# Patient Record
Sex: Female | Born: 1990 | Race: Black or African American | Hispanic: No | Marital: Single | Smoking: Never smoker
Health system: Southern US, Community
[De-identification: ages and names within clinical notes are randomized; demographics above are authoritative.]

---
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
## 2014-04-24 ENCOUNTER — Ambulatory Visit: Payer: BLUE CROSS/BLUE SHIELD

## 2014-05-11 ENCOUNTER — Emergency Department (HOSPITAL_COMMUNITY)
Admission: EM | Admit: 2014-05-11 | Discharge: 2014-05-11 | Disposition: A | Discharge status: Home / Self Care | Payer: No Typology Code available for payment source | Attending: Emergency Medicine | Admitting: Emergency Medicine

## 2014-05-11 ENCOUNTER — Emergency Department (HOSPITAL_COMMUNITY): Payer: No Typology Code available for payment source

## 2014-05-11 ENCOUNTER — Encounter (HOSPITAL_COMMUNITY): Payer: Self-pay | Admitting: Family Medicine

## 2014-05-11 DIAGNOSIS — S4990XA Unspecified injury of shoulder and upper arm, unspecified arm, initial encounter: Secondary | ICD-10-CM | POA: Diagnosis not present

## 2014-05-11 DIAGNOSIS — Y9389 Activity, other specified: Secondary | ICD-10-CM | POA: Diagnosis not present

## 2014-05-11 DIAGNOSIS — S29002A Unspecified injury of muscle and tendon of back wall of thorax, initial encounter: Secondary | ICD-10-CM | POA: Insufficient documentation

## 2014-05-11 DIAGNOSIS — Y998 Other external cause status: Secondary | ICD-10-CM | POA: Diagnosis not present

## 2014-05-11 DIAGNOSIS — Y92410 Unspecified street and highway as the place of occurrence of the external cause: Secondary | ICD-10-CM | POA: Insufficient documentation

## 2014-05-11 DIAGNOSIS — M546 Pain in thoracic spine: Secondary | ICD-10-CM

## 2014-05-11 DIAGNOSIS — S199XXA Unspecified injury of neck, initial encounter: Secondary | ICD-10-CM | POA: Diagnosis not present

## 2014-05-11 DIAGNOSIS — S0990XA Unspecified injury of head, initial encounter: Secondary | ICD-10-CM | POA: Insufficient documentation

## 2014-05-11 DIAGNOSIS — M542 Cervicalgia: Secondary | ICD-10-CM

## 2014-05-11 MED ORDER — HYDROCODONE-ACETAMINOPHEN 5-325 MG PO TABS: 1.0000 | ORAL_TABLET | Freq: Four times a day (QID) | ORAL | Status: AC | PRN

## 2014-05-11 MED ORDER — HYDROCODONE-ACETAMINOPHEN 5-325 MG PO TABS
2.0000 | ORAL_TABLET | Freq: Once | ORAL | Status: AC
  Administered 2014-05-11: 2 via ORAL
  Filled 2014-05-11: qty 2

## 2014-05-11 MED ORDER — NAPROXEN 500 MG PO TABS
500.0000 mg | ORAL_TABLET | Freq: Two times a day (BID) | ORAL | Status: AC
Start: 2014-05-11 — End: ?

## 2014-05-11 MED ORDER — METHOCARBAMOL 500 MG PO TABS
500.0000 mg | ORAL_TABLET | Freq: Once | ORAL | Status: AC
  Administered 2014-05-11: 500 mg via ORAL
  Filled 2014-05-11: qty 1

## 2014-05-11 MED ORDER — METHOCARBAMOL 500 MG PO TABS: 500.0000 mg | ORAL_TABLET | Freq: Two times a day (BID) | ORAL | Status: AC
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
## 2014-05-13 ENCOUNTER — Emergency Department (HOSPITAL_COMMUNITY): Payer: No Typology Code available for payment source

## 2014-05-13 ENCOUNTER — Encounter (HOSPITAL_COMMUNITY): Payer: Self-pay | Admitting: Emergency Medicine

## 2014-05-13 ENCOUNTER — Emergency Department (HOSPITAL_COMMUNITY)
Admission: EM | Admit: 2014-05-13 | Discharge: 2014-05-13 | Disposition: A | Discharge status: Home / Self Care | Payer: No Typology Code available for payment source | Attending: Emergency Medicine | Admitting: Emergency Medicine

## 2014-05-13 DIAGNOSIS — T148XXA Other injury of unspecified body region, initial encounter: Secondary | ICD-10-CM

## 2014-05-13 DIAGNOSIS — Y998 Other external cause status: Secondary | ICD-10-CM | POA: Insufficient documentation

## 2014-05-13 DIAGNOSIS — Y92410 Unspecified street and highway as the place of occurrence of the external cause: Secondary | ICD-10-CM | POA: Diagnosis not present

## 2014-05-13 DIAGNOSIS — R52 Pain, unspecified: Secondary | ICD-10-CM

## 2014-05-13 DIAGNOSIS — S199XXA Unspecified injury of neck, initial encounter: Secondary | ICD-10-CM | POA: Insufficient documentation

## 2014-05-13 DIAGNOSIS — S0990XA Unspecified injury of head, initial encounter: Secondary | ICD-10-CM | POA: Insufficient documentation

## 2014-05-13 DIAGNOSIS — R519 Headache, unspecified: Secondary | ICD-10-CM

## 2014-05-13 DIAGNOSIS — M542 Cervicalgia: Secondary | ICD-10-CM

## 2014-05-13 DIAGNOSIS — S3992XA Unspecified injury of lower back, initial encounter: Secondary | ICD-10-CM | POA: Insufficient documentation

## 2014-05-13 DIAGNOSIS — Y9389 Activity, other specified: Secondary | ICD-10-CM | POA: Diagnosis not present

## 2014-05-13 DIAGNOSIS — R51 Headache: Secondary | ICD-10-CM

## 2014-05-13 DIAGNOSIS — R6889 Other general symptoms and signs: Secondary | ICD-10-CM

## 2014-05-13 MED ORDER — IBUPROFEN 200 MG PO TABS
400.0000 mg | ORAL_TABLET | Freq: Once | ORAL | Status: AC
  Administered 2014-05-13: 400 mg via ORAL
  Filled 2014-05-13: qty 2
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
## 2014-05-13 NOTE — ED Notes (Signed)
Bed: WA19 Expected date:  Expected time:  Means of arrival:  Comments: 
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
